# Patient Record
Sex: Female | Born: 1960 | Race: Black or African American | Hispanic: No | Smoking: Former smoker
Health system: Southern US, Community
[De-identification: ages and names within clinical notes are randomized; demographics above are authoritative.]

## PROBLEM LIST (undated history)

## (undated) DIAGNOSIS — K5792 Diverticulitis of intestine, part unspecified, without perforation or abscess without bleeding: Secondary | ICD-10-CM

## (undated) DIAGNOSIS — E119 Type 2 diabetes mellitus without complications: Secondary | ICD-10-CM

## (undated) DIAGNOSIS — M1990 Unspecified osteoarthritis, unspecified site: Secondary | ICD-10-CM

## (undated) DIAGNOSIS — M797 Fibromyalgia: Secondary | ICD-10-CM

## (undated) DIAGNOSIS — I10 Essential (primary) hypertension: Secondary | ICD-10-CM

## (undated) DIAGNOSIS — I509 Heart failure, unspecified: Secondary | ICD-10-CM

## (undated) DIAGNOSIS — I739 Peripheral vascular disease, unspecified: Secondary | ICD-10-CM

## (undated) HISTORY — PX: BACK SURGERY: SHX140

## (undated) HISTORY — PX: ABDOMINAL HYSTERECTOMY: SHX81

## (undated) HISTORY — PX: PULMONARY VALVE REPLACEMENT: SHX173

## (undated) HISTORY — PX: COLON SURGERY: SHX602

## (undated) HISTORY — PX: NECK SURGERY: SHX720

---

## 2019-05-14 ENCOUNTER — Emergency Department (HOSPITAL_BASED_OUTPATIENT_CLINIC_OR_DEPARTMENT_OTHER)
Admission: EM | Admit: 2019-05-14 | Discharge: 2019-05-14 | Disposition: A | Discharge status: Home / Self Care | Payer: Medicare HMO | Attending: Emergency Medicine | Admitting: Emergency Medicine

## 2019-05-14 ENCOUNTER — Other Ambulatory Visit: Payer: Self-pay

## 2019-05-14 ENCOUNTER — Encounter (HOSPITAL_BASED_OUTPATIENT_CLINIC_OR_DEPARTMENT_OTHER): Payer: Self-pay

## 2019-05-14 ENCOUNTER — Emergency Department (HOSPITAL_BASED_OUTPATIENT_CLINIC_OR_DEPARTMENT_OTHER): Payer: Medicare HMO

## 2019-05-14 DIAGNOSIS — I509 Heart failure, unspecified: Secondary | ICD-10-CM | POA: Diagnosis not present

## 2019-05-14 DIAGNOSIS — E119 Type 2 diabetes mellitus without complications: Secondary | ICD-10-CM | POA: Diagnosis not present

## 2019-05-14 DIAGNOSIS — M25551 Pain in right hip: Secondary | ICD-10-CM | POA: Insufficient documentation

## 2019-05-14 DIAGNOSIS — M545 Low back pain, unspecified: Secondary | ICD-10-CM

## 2019-05-14 DIAGNOSIS — Z6841 Body Mass Index (BMI) 40.0 and over, adult: Secondary | ICD-10-CM | POA: Diagnosis not present

## 2019-05-14 DIAGNOSIS — I110 Hypertensive heart disease with heart failure: Secondary | ICD-10-CM | POA: Diagnosis not present

## 2019-05-14 DIAGNOSIS — E669 Obesity, unspecified: Secondary | ICD-10-CM | POA: Diagnosis not present

## 2019-05-14 DIAGNOSIS — Z87891 Personal history of nicotine dependence: Secondary | ICD-10-CM | POA: Diagnosis not present

## 2019-05-14 DIAGNOSIS — G8929 Other chronic pain: Secondary | ICD-10-CM | POA: Diagnosis not present

## 2019-05-14 DIAGNOSIS — M25552 Pain in left hip: Secondary | ICD-10-CM | POA: Diagnosis not present

## 2019-05-14 HISTORY — DX: Heart failure, unspecified: I50.9

## 2019-05-14 HISTORY — DX: Type 2 diabetes mellitus without complications: E11.9

## 2019-05-14 HISTORY — DX: Unspecified osteoarthritis, unspecified site: M19.90

## 2019-05-14 HISTORY — DX: Fibromyalgia: M79.7

## 2019-05-14 HISTORY — DX: Essential (primary) hypertension: I10

## 2019-05-14 HISTORY — DX: Peripheral vascular disease, unspecified: I73.9

## 2019-05-14 HISTORY — DX: Diverticulitis of intestine, part unspecified, without perforation or abscess without bleeding: K57.92

## 2019-05-14 MED ORDER — METHOCARBAMOL 500 MG PO TABS: 500.0000 mg | ORAL_TABLET | Freq: Two times a day (BID) | ORAL | 0 refills | Status: AC

## 2019-05-14 MED ORDER — HYDROMORPHONE HCL 1 MG/ML IJ SOLN
2.0000 mg | Freq: Once | INTRAMUSCULAR | Status: AC
  Administered 2019-05-14: 15:00:00 2 mg via INTRAVENOUS
  Filled 2019-05-14: qty 2

## 2019-05-14 MED ORDER — DIAZEPAM 5 MG PO TABS
5.0000 mg | ORAL_TABLET | Freq: Once | ORAL | Status: AC
  Administered 2019-05-14: 15:00:00 5 mg via ORAL
  Filled 2019-05-14: qty 1

## 2019-05-14 MED ORDER — HYDROMORPHONE HCL 1 MG/ML IJ SOLN
1.0000 mg | Freq: Once | INTRAMUSCULAR | Status: AC
  Administered 2019-05-14: 14:00:00 1 mg via INTRAMUSCULAR
  Filled 2019-05-14: qty 1

## 2019-05-14 NOTE — TOC Initial Note (Addendum)
Transition of Care [facility]) - Initial/Assessment Note    Patient Details  Name: Francenia Solomon MRN: [id number] Date of Birth: 1961-05-26  Transition of Care [facility]) CM/SW Contact:    Erenest Rasher, RN Phone Number: [phone] 05/14/2019, 3:28 PM  Clinical Narrative:                 Spoke to pt and states she lives [facility] [employer]. States she is from [place]. Has an aide that comes Mon-Sun for 2.5 hours per day. States she has RW with seat, but reports it is too small. But needs a Environmental manager wide and bedside commode wide. Pt will be receiving a CPAP and oxygen from [facility]. Contacted [facility] and they can deliver tomorrow with CPAP. Faxed to [employer] office. [place] per pt preference, [facility] unable to accept referral. Contacted [facility] for [facility], waiting call back.   [facility] accepted referral.   Expected Discharge Plan: [place] Barriers to Discharge: Continued Medical Work up   Patient Goals and CMS Choice Patient states their goals for this hospitalization and ongoing recovery are:: need surgery for back pain, plan to move back to [place] to be close to family CMS Medicare.gov Compare Post Acute Care list provided to:: Patient Choice offered to / list presented to : Patient  Expected Discharge Plan and Services Expected Discharge Plan: [place] Choice: [place] arrangements for the past 2 months: Apartment                 DME Arranged: 3-N-1, Walker rolling                    Prior Living Arrangements/Services Living arrangements for the past 2 months: Apartment Lives with:: Self Patient language and need for interpreter reviewed:: Yes Do you feel safe going back to the place where you live?: Yes      Need for Family Participation in Patient Care: Yes (Comment) Care giver support system in place?: Yes (comment) Current home services: DME(Rolling Walker with  seat) Criminal Activity/Legal Involvement Pertinent to Current Situation/Hospitalization: No - Comment as needed  Activities of Daily Living      Permission Sought/Granted Permission sought to share information with : Case Manager, PCP, Family Supports, Other (comment) Permission granted to share information with : Yes, Verbal Permission Granted  Share Information with NAME: Paula Solomon  Permission granted to share info w AGENCY: [place], [place] granted to share info w Relationship: brother  Permission granted to share info w Contact Information: [phone]  Emotional Assessment       Orientation: : Oriented to Self, Oriented to Place, Oriented to  Time, Oriented to Situation   Psych Involvement: No (comment)  Admission diagnosis:  LOWER BACK AND BOTH HIPS PAIN There are no active problems to display for this patient.  PCP:  Henderson Baltimore, FNP Pharmacy:  No Pharmacies Listed    Social Determinants of Health (SDOH) Interventions    Readmission Risk Interventions No flowsheet data found.

## 2019-05-14 NOTE — ED Notes (Signed)
Patient transported to X-ray 

## 2019-05-14 NOTE — Discharge Instructions (Signed)
Take Robaxin twice daily as needed for muscle pain or spasms.  Do not combine this with Zanaflex.  Do not take with oxycodone.  Use ice and heat alternating 20 minutes on, 20 minutes off.  Physical therapy will call you to arrange appointment.  Please follow-up with 1 of the spine surgeons below or follow-up with the spine surgeon you saw previously for further evaluation and treatment.  Please return to emergency department develop complete numbness of your legs or groin, loss of bowel or bladder control, or any other concerning symptoms.

## 2019-05-14 NOTE — ED Triage Notes (Addendum)
Pt c/o hx chronic neck/back pain-back pain worse x 1 month-pt to triage in w/c-pt states she goes to pain clinic in [facility]

## 2019-05-14 NOTE — ED Notes (Signed)
ED Provider at bedside. 

## 2019-05-14 NOTE — ED Provider Notes (Signed)
[facility] EMERGENCY DEPARTMENT Provider Note   CSN: [id number] Arrival date & time: 05/14/19  1235     History   Chief Complaint Chief Complaint  Patient presents with  . Back Pain    HPI Paula Solomon is a 58 y.o. female with history of CHF, diabetes, hypertension, DJD, fibromyalgia who presents with acute on chronic back pain and bilateral hip pain.  Patient reports she has been had low back pain and hip pain for several years and [place].  She states that she does not feel like they are treating her completely and the pain medication does not help.  She feels like something is truly wrong with her and she wants to know what that is.  She has intermittent numbness and tingling to her legs.  Her pain is worse with standing and walking around.  She denies any saddle anesthesia or bowel or bladder dysfunction.  She denies any fevers, chest pain, shortness of breath, history of cancer or IVDU, recent injection or surgery.  Patient is taking oxycodone 10 mg for her pain.  Per review of the [place] controlled substance database, patient also has prescriptions for pregabalin.  Per chart review, patient had MRIs of the cervical, thoracic, and lumbar spine and 05/2018.  Cervical spine MRI showed postsurgical change with some foraminal narrowing at C2-3 and C3-4.  Thoracic MRI showed postsurgical change as well as central protrusion and posterior element hypertrophy at T2-3.  Lumbar MRI showed minor lumbar spondylosis without acute abnormality and annular bulge and posterior element hypertrophy at L4-5 without impingement.  Patient reports bilateral hip replacements around 58 years old.  She reports her main pain today is in her low back and hips rating down her legs.  She denies any cervical or thoracic pain at this time.     HPI  Past Medical History:  Diagnosis Date  . CHF (congestive heart failure) (HCC)   . Diabetes mellitus without complication (HCC)   .  Diverticulitis   . DJD (degenerative joint disease)   . Fibromyalgia   . Hypertension   . Osteoarthritis   . PAD (peripheral artery disease) (HCC)     There are no active problems to display for this patient.   Past Surgical History:  Procedure Laterality Date  . ABDOMINAL HYSTERECTOMY    . BACK SURGERY    . COLON SURGERY    . NECK SURGERY    . PULMONARY VALVE REPLACEMENT       OB History   No obstetric history on file.      Home Medications    Prior to Admission medications   Medication Sig Start Date End Date Taking? Authorizing Provider  methocarbamol (ROBAXIN) 500 MG tablet Take 1 tablet (500 mg total) by mouth 2 (two) times daily. 05/14/19   Emi HolesLaw, Anastacia Reinecke M, PA-C    Family History No family history on file.  Social History Social History   Tobacco Use  . Smoking status: Former Games developermoker  . Smokeless tobacco: Never Used  Substance Use Topics  . Alcohol use: Never    Frequency: Never  . Drug use: Never     Allergies   Asa [aspirin] and Pyridium [phenazopyridine hcl]   Review of Systems Review of Systems  Constitutional: Negative for chills and fever.  HENT: Negative for facial swelling and sore throat.   Respiratory: Negative for shortness of breath.   Cardiovascular: Negative for chest pain.  Gastrointestinal: Negative for abdominal pain, nausea and vomiting.  Genitourinary: Negative for dysuria.  Musculoskeletal: Positive for arthralgias and back pain.  Skin: Negative for rash and wound.  Neurological: Positive for numbness. Negative for headaches.  Psychiatric/Behavioral: The patient is not nervous/anxious.      Physical Exam Updated Vital Signs BP 104/66 (BP Location: Right Arm)   Pulse 79   Temp 99 F (37.2 C) (Oral)   Resp 18   Ht 5\' 6"  (1.676 m)   Wt 124.7 kg   SpO2 97%   BMI 44.39 kg/m   Physical Exam Vitals signs and nursing note reviewed.  Constitutional:      General: She is not in acute distress.    Appearance: She is  well-developed. She is obese. She is not diaphoretic.  HENT:     Head: Normocephalic and atraumatic.     Mouth/Throat:     Pharynx: No oropharyngeal exudate.  Eyes:     General: No scleral icterus.       Right eye: No discharge.        Left eye: No discharge.     Conjunctiva/sclera: Conjunctivae normal.     Pupils: Pupils are equal, round, and reactive to light.  Neck:     Musculoskeletal: Normal range of motion and neck supple.     Thyroid: No thyromegaly.  Cardiovascular:     Rate and Rhythm: Normal rate and regular rhythm.     Heart sounds: Normal heart sounds. No murmur. No friction rub. No gallop.   Pulmonary:     Effort: Pulmonary effort is normal. No respiratory distress.     Breath sounds: Normal breath sounds. No stridor. No wheezing or rales.  Abdominal:     General: Bowel sounds are normal. There is no distension.     Palpations: Abdomen is soft.     Tenderness: There is no abdominal tenderness. There is no guarding or rebound.  Musculoskeletal:       Back:       Legs:     Comments: 5/5 strength, normal sensation, 2+ patellar reflexes to bilateral lower extremities; patient hysterically crying on strength exam, however with positive straight leg raise bilaterally  Lymphadenopathy:     Cervical: No cervical adenopathy.  Skin:    General: Skin is warm and dry.     Coloration: Skin is not pale.     Findings: No rash.  Neurological:     Mental Status: She is alert.     Coordination: Coordination normal.      ED Treatments / Results  Labs (all labs ordered are listed, but only abnormal results are displayed) Labs Reviewed - No data to display  EKG None  Radiology Dg Lumbar Spine Complete  Result Date: 05/14/2019 CLINICAL DATA:  Chronic back pain. EXAM: LUMBAR SPINE - COMPLETE 4+ VIEW COMPARISON:  MRI lumbosacral spine May 30, 2018 FINDINGS: There is no evidence of lumbar spine fracture. Alignment is normal. Mild osteoarthritic changes at L4-L5 and  L5-S1. Multilevel posterior facet arthropathy, mild, in the lower lumbosacral spine. IMPRESSION: Mild osteoarthritic changes at L4-L5 and L5-S1. Electronically Signed   By: Ted Mcalpine M.D.   On: 05/14/2019 14:41   Dg Hips Bilat W Or Wo Pelvis 3-4 Views  Result Date: 05/14/2019 CLINICAL DATA:  Chronic lower back pain with bilateral hip pain. No known injury. EXAM: DG HIP (WITH OR WITHOUT PELVIS) 3-4V BILAT COMPARISON:  Radiographs of April 28, 2018. FINDINGS: Status post bilateral total hip arthroplasties. The acetabular and femoral components appear to be well situated. No fracture or  dislocation is noted. Sacroiliac joints are unremarkable. IMPRESSION: Status post bilateral total hip arthroplasties. No other significant abnormality seen in the pelvis. Electronically Signed   By: Marijo Conception M.D.   On: 05/14/2019 14:41    Procedures Procedures (including critical care time)  Medications Ordered in ED Medications  HYDROmorphone (DILAUDID) injection 1 mg (1 mg Intramuscular Given 05/14/19 1348)  HYDROmorphone (DILAUDID) injection 2 mg (2 mg Intravenous Given 05/14/19 1520)  diazepam (VALIUM) tablet 5 mg (5 mg Oral Given 05/14/19 1518)     Initial Impression / Assessment and Plan / ED Course  I have reviewed the triage vital signs and the nursing notes.  Pertinent labs & imaging results that were available during my care of the patient were reviewed by me and considered in my medical decision making (see chart for details).        Patient presenting with chronic back pain that has been worsening over the past month.  She lives alone and is nervous about her increasing difficulty ambulating.  Lumbar x-ray shows mild osteoarthritic changes at L4-L5 and L5-S1.  Bilateral hip x-ray shows status post bilateral total hip arthroplasties and no significant abnormality.  Patient's pain improved with Dilaudid IM and Valium in the ED.  Will discharge home with Robaxin instead of patient's  Zanaflex that she has been taking without relief.  Advised not to combine these.  Patient goes to a pain clinic.  She does not want to follow-up with the neurosurgeon she saw before, so given 2 other referrals.  Case management arranged for home walker as well as physical therapy.  Normal neuro exam today without any red flag symptoms including saddle anesthesia or bowel or bladder dysfunction.  Low suspicion for cauda equina.  No indication for emergent advanced imaging today.  Return precautions discussed.  Patient understands and agrees with plan.  Patient vital stable her ED course and discharged in satisfactory condition.  Patient also advised by my attending, Dr. Gilford Raid, who guided the patient's management and agrees with plan.  Final Clinical Impressions(s) / ED Diagnoses   Final diagnoses:  Chronic bilateral low back pain, unspecified whether sciatica present    ED Discharge Orders         Ordered    methocarbamol (ROBAXIN) 500 MG tablet  2 times daily     05/14/19 1619           Frederica Kuster, PA-C 05/14/19 2206    Isla Pence, MD 05/23/19 [phone]

## 2020-02-19 IMAGING — DX DG HIP (WITH OR WITHOUT PELVIS) 3-4V BILAT
5 series · 5 of 5 positions shown · non-contrast
Comparison: Radiographs April 28, 2018.

CLINICAL DATA: Chronic lower back pain with bilateral hip pain. No
known injury.

EXAM:
DG HIP (WITH OR WITHOUT PELVIS) 3-4V BILAT

[pelvis ap]
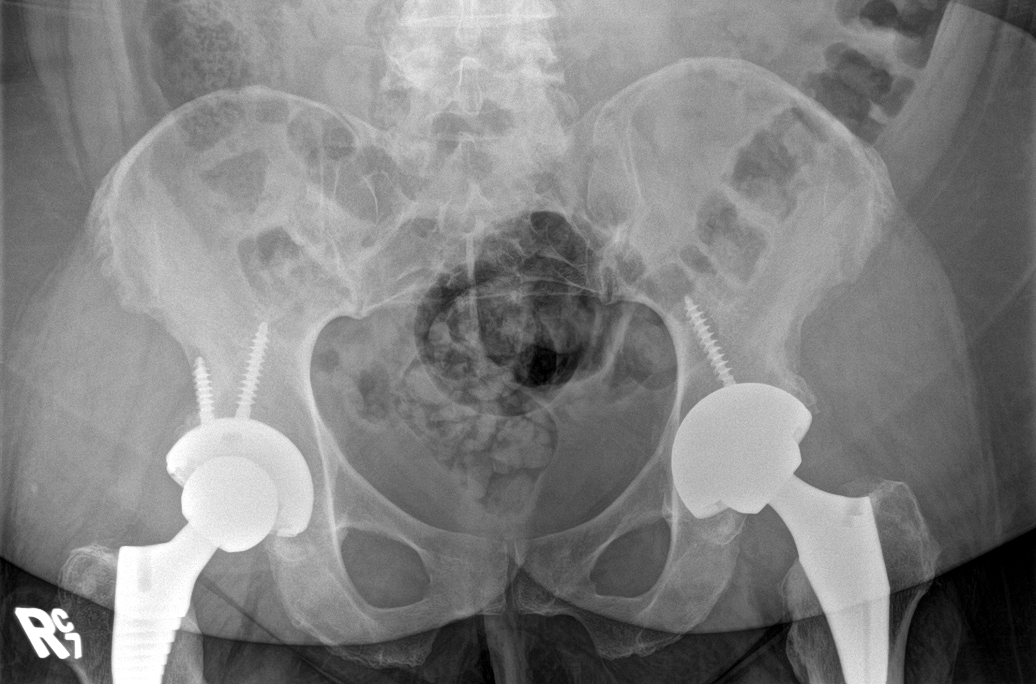

[hip ap (1 of 2)]
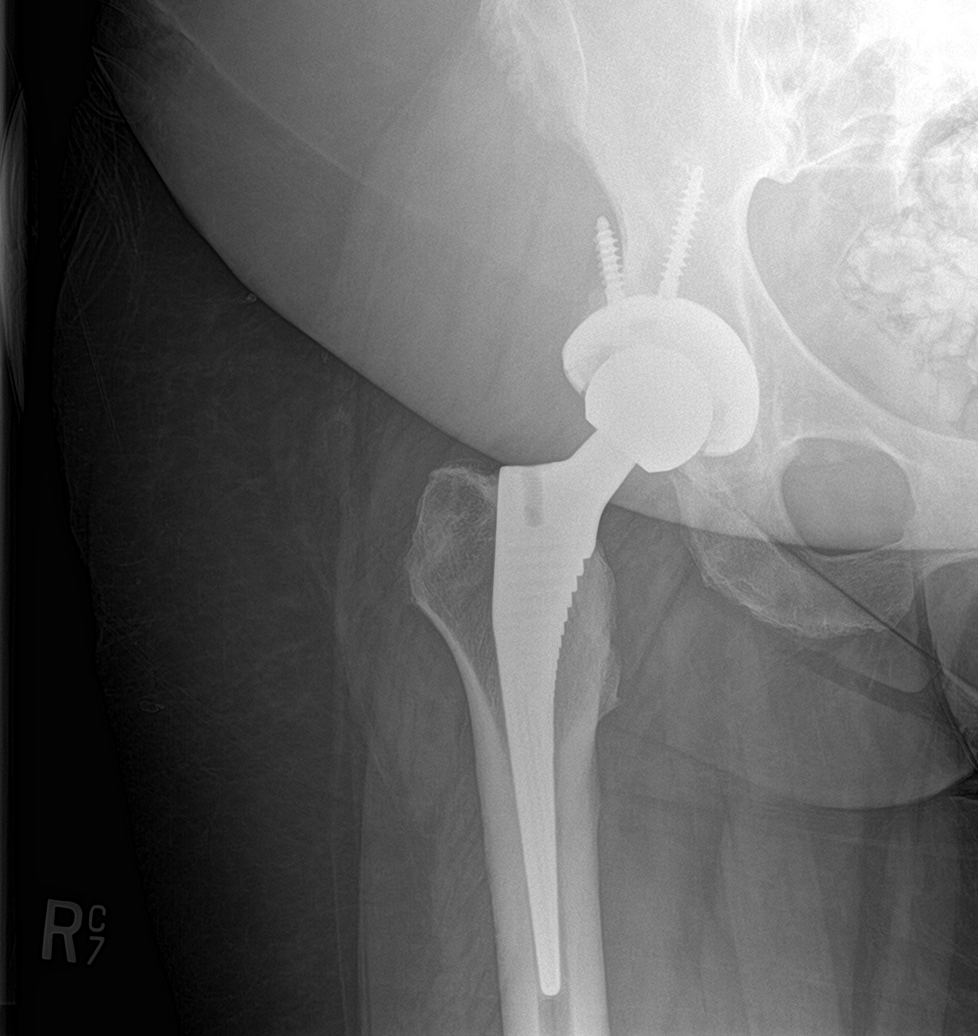

[hip lat (1 of 2)]
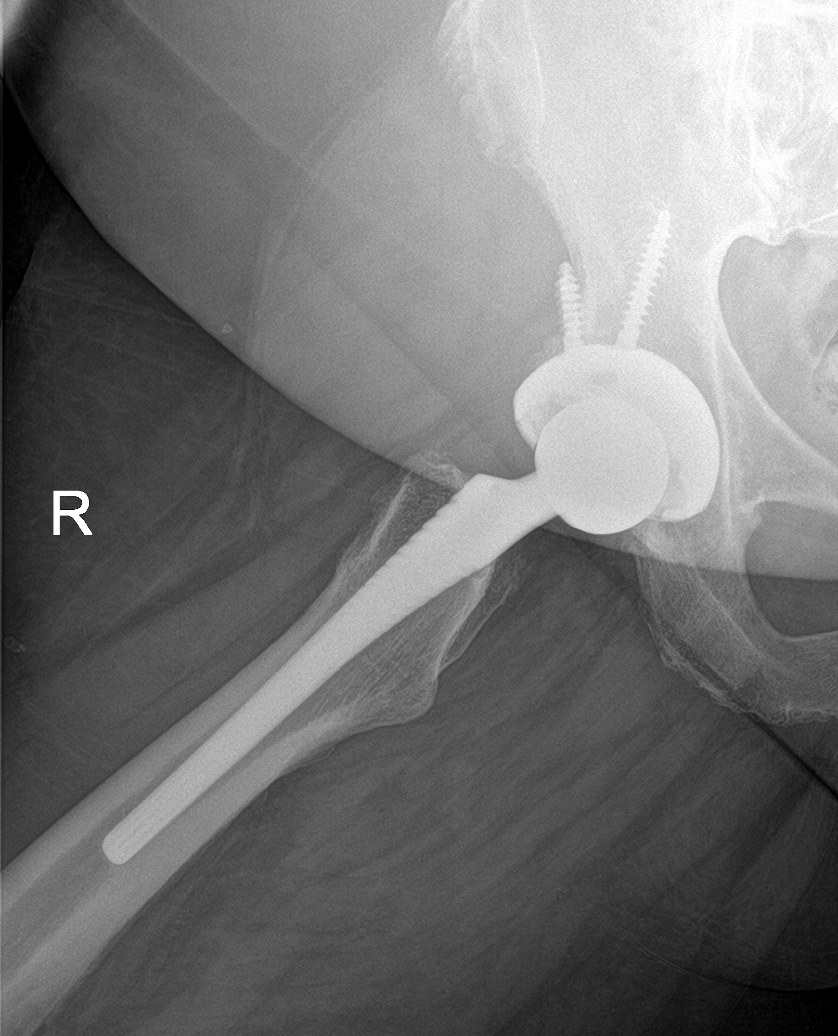

[hip ap (2 of 2)]
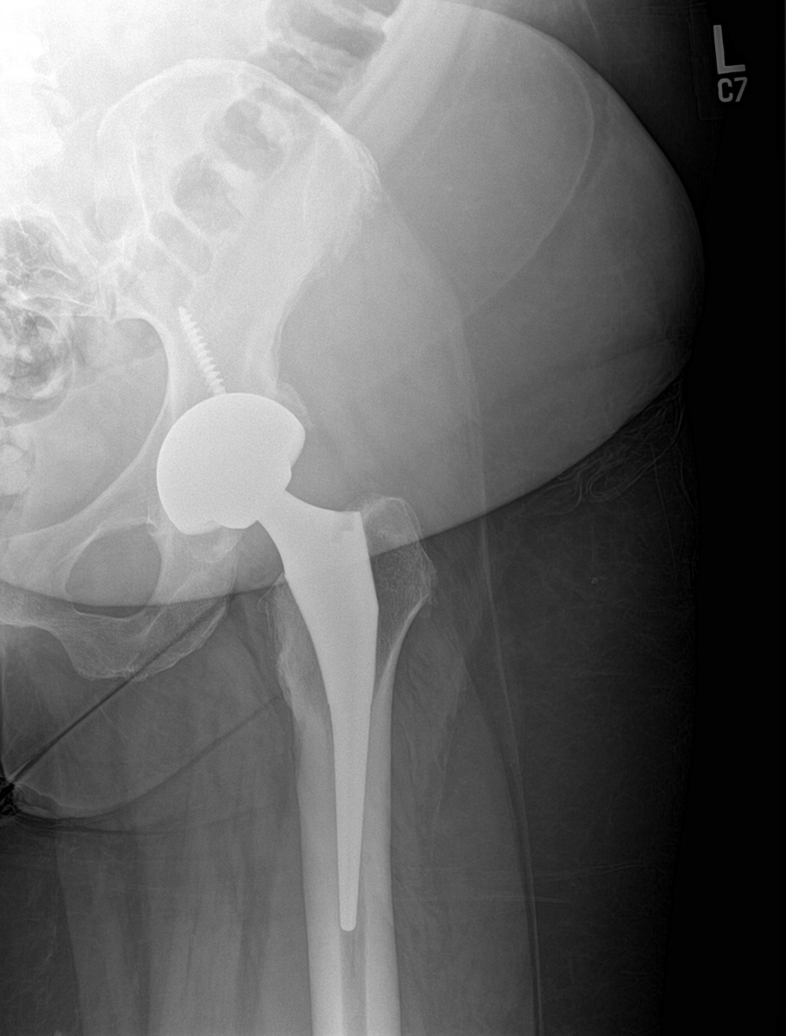

[hip lat (2 of 2)]
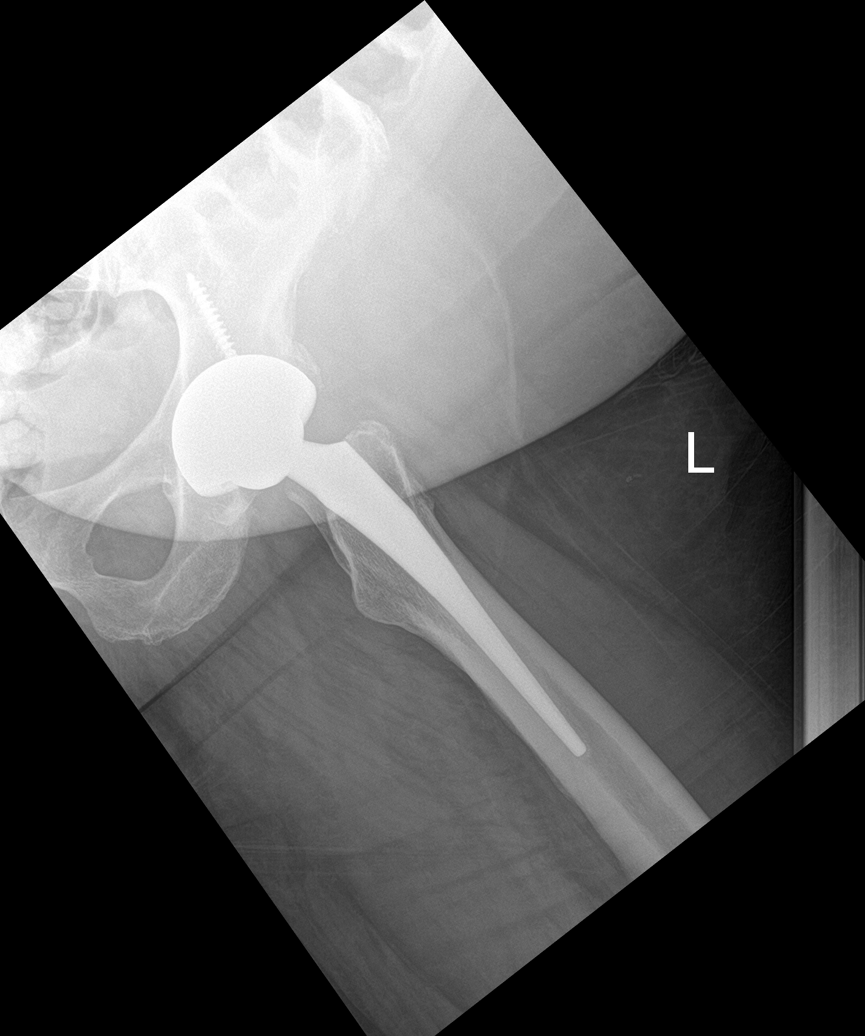

[5 of 5 positions shown; findings below may reference images not displayed]

FINDINGS: Status post bilateral total hip arthroplasties. The acetabular and
femoral components appear to be well situated. No fracture or
dislocation is noted. Sacroiliac joints are unremarkable.
IMPRESSION: Status post bilateral total hip arthroplasties. No other significant
abnormality seen in the pelvis.

## 2020-02-19 IMAGING — DX DG LUMBAR SPINE COMPLETE 4+V
5 series · 5 of 5 positions shown · non-contrast
Comparison: MRI lumbosacral spine May 30, 2018

CLINICAL DATA: Chronic back pain.

EXAM:
LUMBAR SPINE - COMPLETE 4+ VIEW

[l-spine ap]
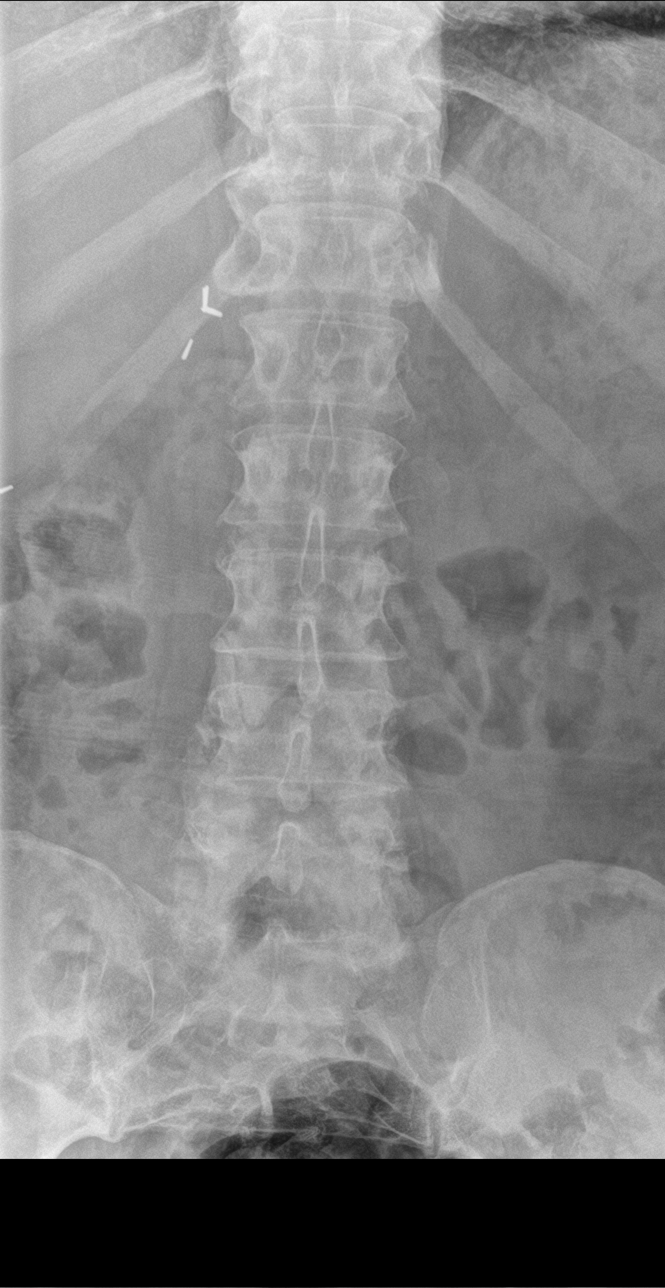

[l-spine obl (1 of 2)]
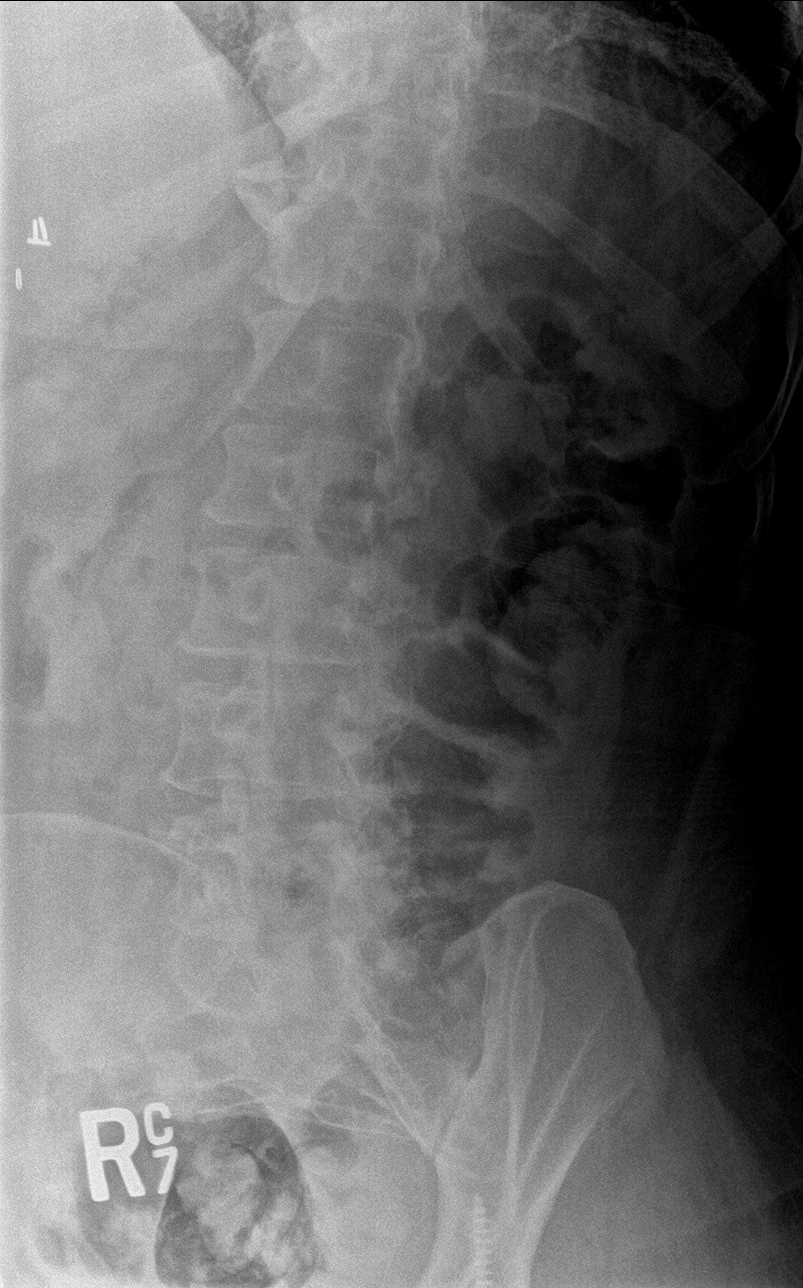

[l-spine obl (2 of 2)]
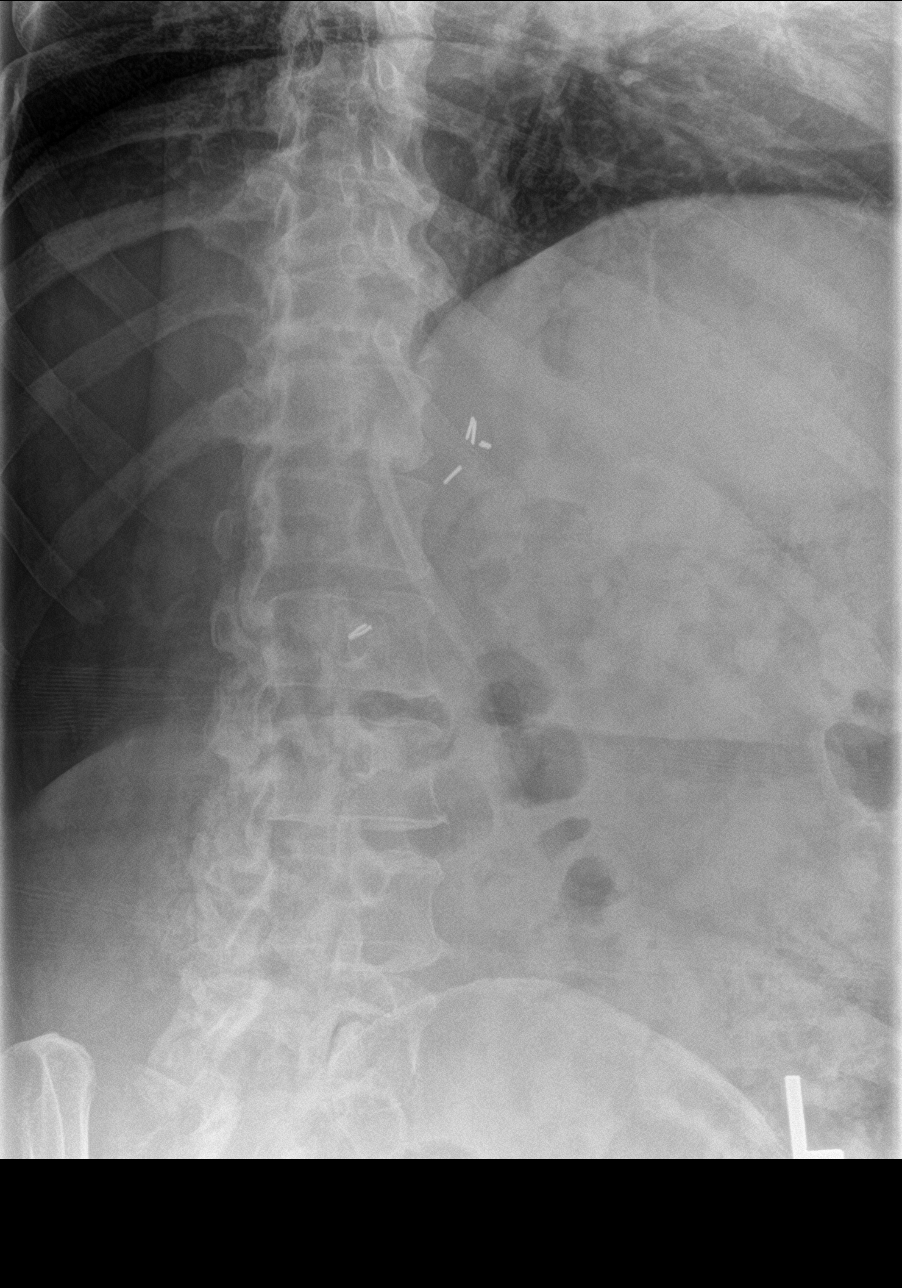

[l-spine lat]
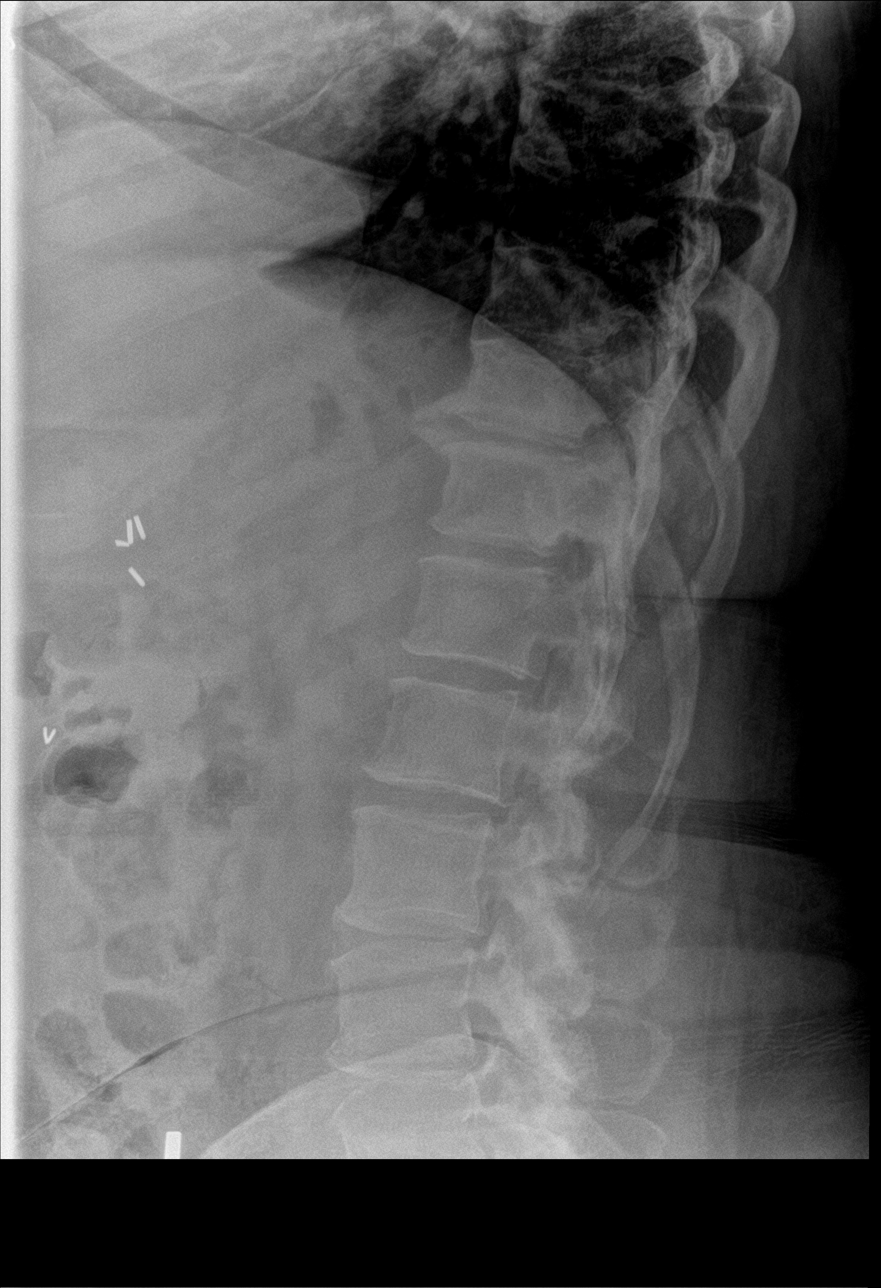

[l-spine spot]
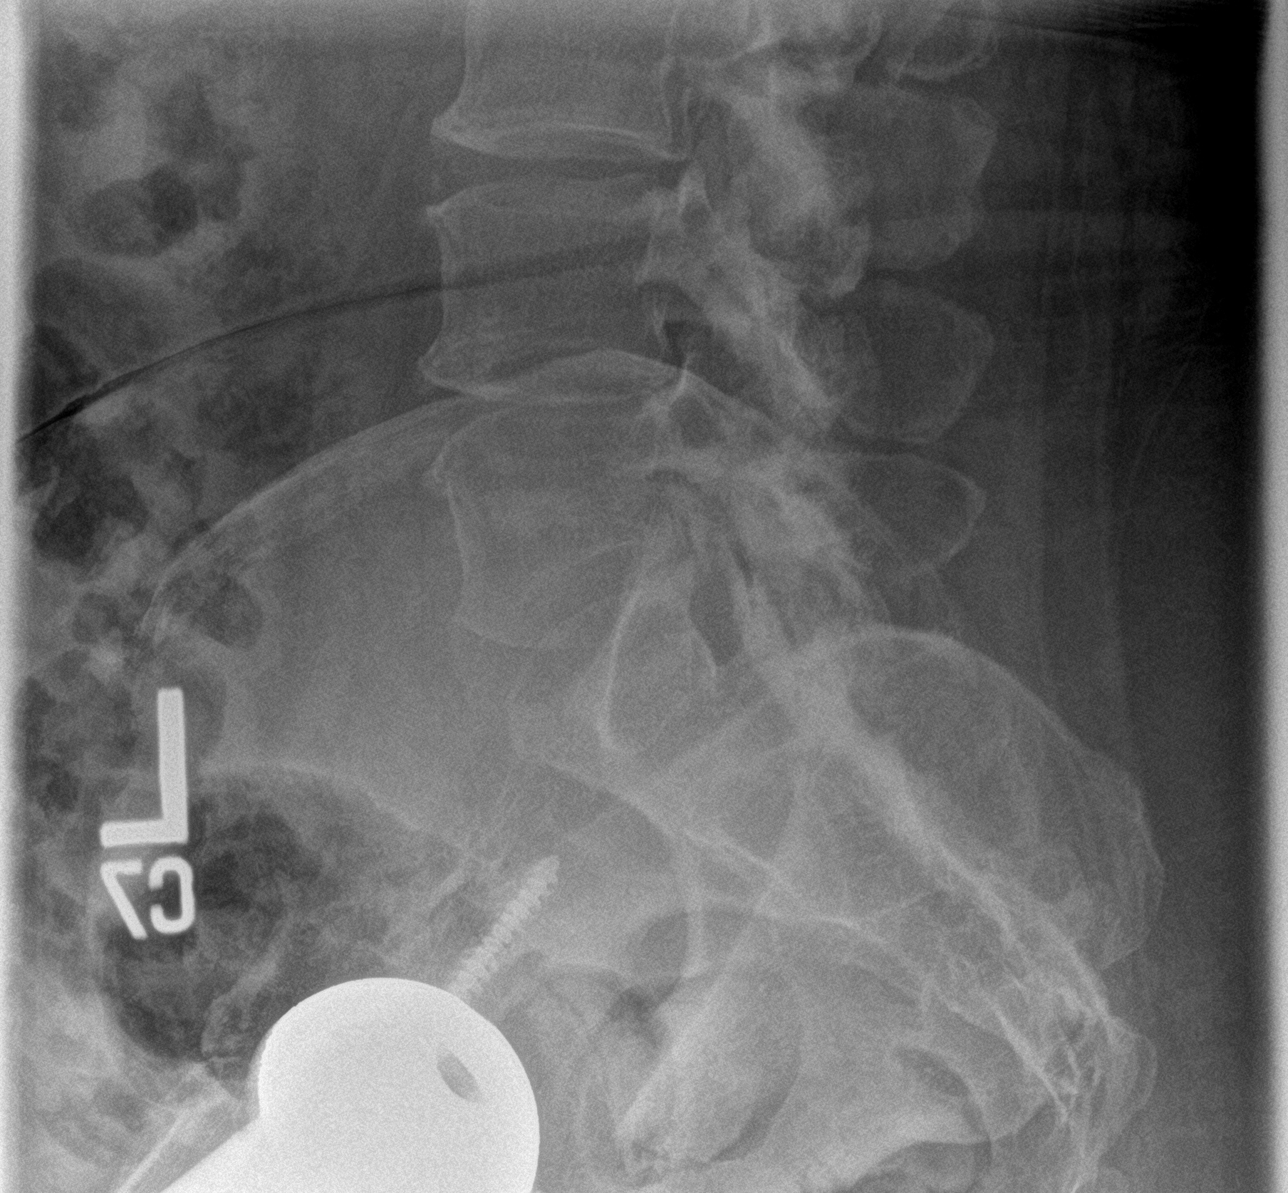

[5 of 5 positions shown; findings below may reference images not displayed]

FINDINGS: There is no evidence of lumbar spine fracture. Alignment is normal.
Mild osteoarthritic changes at L4-L5 and L5-S1. Multilevel posterior
facet arthropathy, mild, in the lower lumbosacral spine.
IMPRESSION: Mild osteoarthritic changes at L4-L5 and L5-S1.
